# Patient Record
Sex: Female | Born: 1995 | Race: Black or African American | Hispanic: No | Marital: Single | Smoking: Never smoker
Health system: Southern US, Community
[De-identification: ages and names within clinical notes are randomized; demographics above are authoritative.]

---
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
## 2016-09-16 ENCOUNTER — Encounter (HOSPITAL_COMMUNITY): Payer: Self-pay

## 2016-09-16 ENCOUNTER — Emergency Department (HOSPITAL_COMMUNITY)
Admission: EM | Admit: 2016-09-16 | Discharge: 2016-09-16 | Disposition: A | Discharge status: Home / Self Care | Payer: No Typology Code available for payment source | Attending: Emergency Medicine | Admitting: Emergency Medicine

## 2016-09-16 DIAGNOSIS — R51 Headache: Secondary | ICD-10-CM | POA: Insufficient documentation

## 2016-09-16 DIAGNOSIS — Y999 Unspecified external cause status: Secondary | ICD-10-CM | POA: Diagnosis not present

## 2016-09-16 DIAGNOSIS — Y92410 Unspecified street and highway as the place of occurrence of the external cause: Secondary | ICD-10-CM | POA: Insufficient documentation

## 2016-09-16 DIAGNOSIS — Z79899 Other long term (current) drug therapy: Secondary | ICD-10-CM | POA: Diagnosis not present

## 2016-09-16 DIAGNOSIS — Y939 Activity, unspecified: Secondary | ICD-10-CM | POA: Insufficient documentation
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
## 2016-09-16 NOTE — Discharge Instructions (Signed)
Use tylenol or ibuprofen for discomfort.
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
## 2019-05-27 ENCOUNTER — Ambulatory Visit (INDEPENDENT_AMBULATORY_CARE_PROVIDER_SITE_OTHER): Payer: BC Managed Care – PPO

## 2019-05-27 ENCOUNTER — Encounter: Payer: Self-pay | Admitting: Podiatry

## 2019-05-27 ENCOUNTER — Other Ambulatory Visit: Payer: Self-pay | Admitting: Podiatry

## 2019-05-27 ENCOUNTER — Ambulatory Visit: Payer: BC Managed Care – PPO | Admitting: Podiatry

## 2019-05-27 ENCOUNTER — Other Ambulatory Visit: Payer: Self-pay

## 2019-05-27 DIAGNOSIS — M722 Plantar fascial fibromatosis: Secondary | ICD-10-CM | POA: Diagnosis not present

## 2019-05-27 DIAGNOSIS — M2142 Flat foot [pes planus] (acquired), left foot: Secondary | ICD-10-CM

## 2019-05-27 DIAGNOSIS — M2141 Flat foot [pes planus] (acquired), right foot: Secondary | ICD-10-CM

## 2019-05-29 ENCOUNTER — Encounter: Payer: Self-pay | Admitting: Podiatry

## 2019-05-29 NOTE — Progress Notes (Signed)
  Subjective:  Patient ID: Amber Robbins, female    DOB: 30-Jul-1996,  MRN: [id number] HPI Chief Complaint  Patient presents with  . Foot Orthotics    Patient requesting orthotics - says feet hurt when she is working sometimes  . New Patient (Initial Visit)    23 y.o. female presents with the above complaint.   ROS: Denies fever chills nausea vomiting muscle aches pains calf pain back pain chest pain shortness of breath.  No past medical history on file. No past surgical history on file. No current outpatient medications on file.  No Known Allergies Review of Systems Objective:  There were no vitals filed for this visit.  General: Well developed, nourished, in no acute distress, alert and oriented x3   Dermatological: Skin is warm, dry and supple bilateral. Nails x 10 are well maintained; remaining integument appears unremarkable at this time. There are no open sores, no preulcerative lesions, no rash or signs of infection present.  Vascular: Dorsalis Pedis artery and Posterior Tibial artery pedal pulses are 2/4 bilateral with immedate capillary fill time. Pedal hair growth present. No varicosities and no lower extremity edema present bilateral.   Neruologic: Grossly intact via light touch bilateral. Vibratory intact via tuning fork bilateral. Protective threshold with Semmes Wienstein monofilament intact to all pedal sites bilateral. Patellar and Achilles deep tendon reflexes 2+ bilateral. No Babinski or clonus noted bilateral.   Musculoskeletal: No gross boney pedal deformities bilateral. No pain, crepitus, or limitation noted with foot and ankle range of motion bilateral. Muscular strength 5/5 in all groups tested bilateral.  She has pain on palpation medial calcaneal tubercle bilateral.  Some tenderness on palpation of the fourth fifth TMT bilateral.  Gait: Unassisted, Nonantalgic.    Radiographs:  Radiographs taken today demonstrate rectus foot no acute findings.     Assessment & Plan:   Assessment: Plantar fasciitis  Plan: She saw Liliane Channel today to have a pair of orthotics made.     Max T. [place], [place]

## 2019-06-03 ENCOUNTER — Ambulatory Visit: Payer: BC Managed Care – PPO | Admitting: Podiatry

## 2019-06-03 ENCOUNTER — Other Ambulatory Visit: Payer: BC Managed Care – PPO | Admitting: Orthotics

## 2019-06-17 ENCOUNTER — Other Ambulatory Visit: Payer: BC Managed Care – PPO | Admitting: Orthotics

## 2019-06-23 ENCOUNTER — Other Ambulatory Visit: Payer: Self-pay

## 2019-06-23 ENCOUNTER — Ambulatory Visit: Payer: BC Managed Care – PPO | Admitting: Orthotics

## 2019-06-23 DIAGNOSIS — M722 Plantar fascial fibromatosis: Secondary | ICD-10-CM

## 2019-06-23 DIAGNOSIS — M6788 Other specified disorders of synovium and tendon, other site: Secondary | ICD-10-CM

## 2019-06-23 NOTE — Progress Notes (Signed)
Patient came in today to pick up custom made foot orthotics.  The goals were accomplished and the patient reported no dissatisfaction with said orthotics.  Patient was advised of breakin period and how to report any issues. 

## 2019-07-28 ENCOUNTER — Other Ambulatory Visit: Payer: Self-pay

## 2019-07-28 ENCOUNTER — Ambulatory Visit: Payer: BC Managed Care – PPO | Admitting: Podiatry

## 2019-07-28 ENCOUNTER — Encounter: Payer: Self-pay | Admitting: Podiatry

## 2019-07-28 ENCOUNTER — Ambulatory Visit (INDEPENDENT_AMBULATORY_CARE_PROVIDER_SITE_OTHER): Payer: BC Managed Care – PPO

## 2019-07-28 DIAGNOSIS — S93402A Sprain of unspecified ligament of left ankle, initial encounter: Secondary | ICD-10-CM | POA: Diagnosis not present

## 2019-07-28 NOTE — Progress Notes (Signed)
She presents today chief complaint of an ankle injury she obtained to her left foot while stepping down off of the ladder at work.  She states she turned her foot over this morning at work she states that is slightly bruised and swollen she cannot put her full weight down on it in her boot.  Objective: Vital signs are stable she is alert and oriented x3.  Pulses are palpable.  She ambulates to the room with a slight antalgic limp.  Pulses are palpable no visible ecchymosis mild edema overlying the peroneal tendons no edema overlying the anterior talofibular ligament.  She has pain on palpation of the peroneal tendons and abduction and eversion against resistance is painful.  Radiographs taken today do not demonstrate any type of osseous abnormalities.  Assessment: Peroneal tendinitis..  Plan: Discussed etiology pathology and surgical therapies at this point I recommended rest ice compression elevation also placed her in compression anklet and a ankle stabilizer I will follow-up with her in the next few weeks if necessary.

## 2019-09-02 ENCOUNTER — Ambulatory Visit: Payer: BC Managed Care – PPO | Admitting: Podiatry

## 2021-08-09 ENCOUNTER — Ambulatory Visit
Admission: RE | Admit: 2021-08-09 | Discharge: 2021-08-09 | Disposition: A | Discharge status: Home / Self Care | Payer: BC Managed Care – PPO | Source: Ambulatory Visit | Attending: Internal Medicine | Admitting: Internal Medicine

## 2021-08-09 ENCOUNTER — Other Ambulatory Visit: Payer: Self-pay

## 2021-08-09 DIAGNOSIS — M542 Cervicalgia: Secondary | ICD-10-CM

## 2021-08-09 MED ORDER — IBUPROFEN 600 MG PO TABS
600.0000 mg | ORAL_TABLET | Freq: Four times a day (QID) | ORAL | 0 refills | Status: AC | PRN
Start: 2021-08-09 — End: ?

## 2021-08-09 MED ORDER — CYCLOBENZAPRINE HCL 5 MG PO TABS: 5.0000 mg | ORAL_TABLET | Freq: Two times a day (BID) | ORAL | 0 refills | Status: AC | PRN
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
## 2021-08-09 NOTE — Discharge Instructions (Addendum)
Please go to the hospital if symptoms worsen. You have been prescribed a muscle relaxer and an anti-inflammatory medication to take as needed. Be advised that the muscle relaxer can make you drowsy.

## 2021-12-11 ENCOUNTER — Other Ambulatory Visit: Payer: Self-pay

## 2021-12-11 ENCOUNTER — Ambulatory Visit
Admission: EM | Admit: 2021-12-11 | Discharge: 2021-12-11 | Disposition: A | Discharge status: Home / Self Care | Payer: BC Managed Care – PPO

## 2021-12-11 DIAGNOSIS — H9201 Otalgia, right ear: Secondary | ICD-10-CM

## 2021-12-11 DIAGNOSIS — S025XXA Fracture of tooth (traumatic), initial encounter for closed fracture: Secondary | ICD-10-CM

## 2021-12-11 MED ORDER — AMOXICILLIN-POT CLAVULANATE 875-125 MG PO TABS: 1.0000 | ORAL_TABLET | Freq: Two times a day (BID) | ORAL | 0 refills | Status: AC

## 2021-12-11 NOTE — ED Triage Notes (Signed)
Onset early this morning of right ear pain. Has been using warm compresses with temporary relief. Denies uri sxs. No meds taken.  ?

## 2021-12-11 NOTE — ED Provider Notes (Signed)
?[facility] ? ? ? ?CSN: [id number] ?Arrival date & time: 12/11/21  1205 ? ? ?  ? ?History   ?Chief Complaint ?Chief Complaint  ?Patient presents with  ? Otalgia  ?  right  ? ? ?HPI ?Amber Robbins is a 26 y.o. female.  ? ?Patient here today for evaluation of right ear pain that started this morning.  She reports that she has been using warm compresses with mild relief.  She notes that pain is present to tragal area, and questions if maybe this is related to right-sided dental issues.  She notes she has had a right molar that is chipped.  She denies any known fever.  She has not tried any medication for symptoms. ? ?The history is provided by the patient.  ?Otalgia ?Associated symptoms: no congestion, no cough, no diarrhea, no fever, no sore throat and no vomiting   ? ?History reviewed. No pertinent past medical history. ? ?There are no problems to display for this patient. ? ? ?History reviewed. No pertinent surgical history. ? ?OB History   ?No obstetric history on file. ?  ? ? ? ?Home Medications   ? ?Prior to Admission medications   ?Medication Sig Start Date End Date Taking? Authorizing Provider  ?amoxicillin-clavulanate (AUGMENTIN) 875-125 MG tablet Take 1 tablet by mouth every 12 (twelve) hours. 12/11/21  Yes Tomi Bamberger, PA-C  ?medroxyPROGESTERone (DEPO-PROVERA) 150 MG/ML injection Inject 150 mg into the muscle every 3 (three) months.   Yes [provider]  ?cyclobenzaprine (FLEXERIL) 5 MG tablet Take 1 tablet (5 mg total) by mouth 2 (two) times daily as needed for muscle spasms. 08/09/21   Gustavus Bryant, FNP  ?ibuprofen (ADVIL) 600 MG tablet Take 1 tablet (600 mg total) by mouth every 6 (six) hours as needed for mild pain. 08/09/21   Gustavus Bryant, FNP  ? ? ?Family History ?Family History  ?Problem Relation Age of Onset  ? Healthy Mother   ? Healthy Father   ? ? ?Social History ?Social History  ? ?Tobacco Use  ? Smoking status: Never  ? Smokeless tobacco: Never  ?Substance Use  Topics  ? Alcohol use: Not Currently  ? ? ? ?Allergies   ?Patient has no known allergies. ? ? ?Review of Systems ?Review of Systems  ?Constitutional:  Negative for chills and fever.  ?HENT:  Positive for dental problem and ear pain. Negative for congestion and sore throat.   ?Eyes:  Negative for discharge and redness.  ?Respiratory:  Negative for cough and shortness of breath.   ?Gastrointestinal:  Negative for diarrhea, nausea and vomiting.  ? ? ?Physical Exam ?Triage Vital Signs ?ED Triage Vitals  ?Enc Vitals Group  ?   BP 12/11/21 1336 (!) 147/73  ?   Pulse Rate 12/11/21 1336 91  ?   Resp 12/11/21 1336 18  ?   Temp 12/11/21 1336 98.2 ?F (36.8 ?C)  ?   Temp Source 12/11/21 1336 Oral  ?   SpO2 12/11/21 1336 99 %  ?   Weight --   ?   Height --   ?   Head Circumference --   ?   Peak Flow --   ?   Pain Score 12/11/21 1338 8  ?   Pain Loc --   ?   Pain Edu? --   ?   Excl. in GC? --   ? ?No data found. ? ?Updated Vital Signs ?BP (!) 147/73 (BP Location: Left Arm)   Pulse 91  Temp 98.2 ?F (36.8 ?C) (Oral)   Resp 18   SpO2 99%  ?   ? ?Physical Exam ?Vitals and nursing note reviewed.  ?Constitutional:   ?   General: She is not in acute distress. ?   Appearance: Normal appearance. She is not ill-appearing.  ?HENT:  ?   Head: Normocephalic and atraumatic.  ?   Right Ear: Tympanic membrane normal.  ?   Left Ear: Tympanic membrane normal.  ?   Nose: Nose normal. No congestion or rhinorrhea.  ?   Mouth/Throat:  ?   Mouth: Mucous membranes are moist.  ?   Pharynx: No posterior oropharyngeal erythema.  ?Eyes:  ?   Conjunctiva/sclera: Conjunctivae normal.  ?Cardiovascular:  ?   Rate and Rhythm: Normal rate.  ?Pulmonary:  ?   Effort: Pulmonary effort is normal.  ?Neurological:  ?   Mental Status: She is alert.  ?Psychiatric:     ?   Mood and Affect: Mood normal.     ?   Behavior: Behavior normal.     ?   Thought Content: Thought content normal.  ? ? ? ?UC Treatments / Results  ?Labs ?(all labs ordered are listed, but only  abnormal results are displayed) ?Labs Reviewed - No data to display ? ?EKG ? ? ?Radiology ?No results found. ? ?Procedures ?Procedures (including critical care time) ? ?Medications Ordered in UC ?Medications - No data to display ? ?Initial Impression / Assessment and Plan / UC Course  ?I have reviewed the triage vital signs and the nursing notes. ? ?Pertinent labs & imaging results that were available during my care of the patient were reviewed by me and considered in my medical decision making (see chart for details). ? ?  ?Discussed possible referred pain from dental issues.  Will treat with antibiotics to cover possible infection and recommended ibuprofen if needed for pain.  Recommended follow-up if symptoms fail to improve with treatment or worsen. ? ?Final Clinical Impressions(s) / UC Diagnoses  ? ?Final diagnoses:  ?Otalgia, right  ?Closed fracture of tooth, initial encounter  ? ?Discharge Instructions   ?None ?  ? ?ED Prescriptions   ? ? Medication Sig Dispense Auth. Provider  ? amoxicillin-clavulanate (AUGMENTIN) 875-125 MG tablet Take 1 tablet by mouth every 12 (twelve) hours. 14 tablet Tomi Bamberger, PA-C  ? ?  ? ?PDMP not reviewed this encounter. ?  ?Tomi Bamberger, PA-C ?12/11/21 1416 ? ?
